# Patient Record
Sex: Male | Born: 1995 | Hispanic: No | Marital: Single
Health system: Southern US, Community
[De-identification: ages and names within clinical notes are randomized; demographics above are authoritative.]

---

## 2015-12-30 ENCOUNTER — Emergency Department (HOSPITAL_COMMUNITY)
Admission: EM | Admit: 2015-12-30 | Discharge: 2015-12-30 | Disposition: A | Discharge status: Home / Self Care | Payer: Managed Care, Other (non HMO) | Source: Home / Self Care | Attending: Family Medicine | Admitting: Family Medicine

## 2015-12-30 ENCOUNTER — Encounter (HOSPITAL_COMMUNITY): Payer: Self-pay | Admitting: *Deleted

## 2015-12-30 ENCOUNTER — Emergency Department (HOSPITAL_COMMUNITY): Payer: Managed Care, Other (non HMO)

## 2015-12-30 ENCOUNTER — Emergency Department (INDEPENDENT_AMBULATORY_CARE_PROVIDER_SITE_OTHER): Payer: Managed Care, Other (non HMO)

## 2015-12-30 DIAGNOSIS — S20212A Contusion of left front wall of thorax, initial encounter: Secondary | ICD-10-CM | POA: Diagnosis not present

## 2015-12-30 MED ORDER — DICLOFENAC POTASSIUM 50 MG PO TABS: 50.0000 mg | ORAL_TABLET | Freq: Three times a day (TID) | ORAL | Status: AC

## 2015-12-30 NOTE — ED Provider Notes (Addendum)
CSN: [id number]     Arrival date & time 12/30/15  1832 History   First MD Initiated Contact with Patient 12/30/15 1839     Chief Complaint  Patient presents with  . Fall   (Consider location/radiation/quality/duration/timing/severity/associated sxs/prior Treatment) Patient is a 20 y.o. male presenting with fall. The history is provided by the patient.  Fall This is a new problem. The current episode started more than 1 week ago (wrestling with freinds 2 weeks ago and still hurts to left parasternal rib.). Pertinent negatives include no shortness of breath.    History reviewed. No pertinent past medical history. History reviewed. No pertinent past surgical history. History reviewed. No pertinent family history. Social History  Substance Use Topics  . Smoking status: None  . Smokeless tobacco: None  . Alcohol Use: No    Review of Systems  Constitutional: Negative.   Respiratory: Negative for cough, shortness of breath and wheezing.   Cardiovascular: Negative.   Gastrointestinal: Negative.   Musculoskeletal: Negative.   Skin: Negative.   All other systems reviewed and are negative.   Allergies  Phenobarbital  Home Medications   Prior to Admission medications   Medication Sig Start Date End Date Taking? Authorizing Provider  diclofenac (CATAFLAM) 50 MG tablet Take 1 tablet (50 mg total) by mouth 3 (three) times daily. For chest pain. 12/30/15   Linna Hoff, MD   Meds Ordered and Administered this Visit  Medications - No data to display  BP 99/61 mmHg  Pulse 98  Temp(Src) 97.9 F (36.6 C) (Oral)  Resp 16  SpO2 98% No data found.   Physical Exam  ED Course  Procedures (including critical care time)  Labs Review Labs Reviewed - No data to display  Imaging Review Dg Ribs Unilateral W/chest Left  12/30/2015  CLINICAL DATA:  Wrestling 2-1/2 weeks ago with left anterior rib pain since then. EXAM: LEFT RIBS AND CHEST - 3+ VIEW COMPARISON:  None. FINDINGS: The  lungs are clear without focal consolidation, edema, effusion or pneumothorax. Cardio pericardial silhouette is within normal limits for size. Imaged bony structures of the thorax are intact. Oblique views of the left ribs were obtained with a radiopaque BB localizing the area of patient concern. No underlying rib fracture is evident. IMPRESSION: Normal exam.  No evidence for left-sided rib fracture. Electronically Signed   By: Kennith Center M.D.   On: 12/30/2015 19:38   X-rays reviewed and report per radiologist.   Visual Acuity Review  Right Eye Distance:   Left Eye Distance:   Bilateral Distance:    Right Eye Near:   Left Eye Near:    Bilateral Near:         MDM    Meds ordered this encounter  Medications  . diclofenac (CATAFLAM) 50 MG tablet    Sig: Take 1 tablet (50 mg total) by mouth 3 (three) times daily. For chest pain.    Dispense:  30 tablet    Refill:  0       Linna Hoff, MD 12/30/15 1952  Linna Hoff, MD 12/30/15 [phone]

## 2015-12-30 NOTE — ED Notes (Addendum)
Pt  Reports  He   Was  Wrestling    With  Friends       And  Injured         His  l  Rib  Cage  He  Reports  Pain on  Palpation  And  Certain         Movements

## 2016-06-30 IMAGING — DX DG RIBS W/ CHEST 3+V*L*
3 series · 3 of 3 positions shown · non-contrast
Comparison: None.

CLINICAL DATA: Wrestling 2-1/2 weeks ago with left anterior rib
pain since then.

EXAM:
LEFT RIBS AND CHEST - 3+ VIEW

[chest pa]
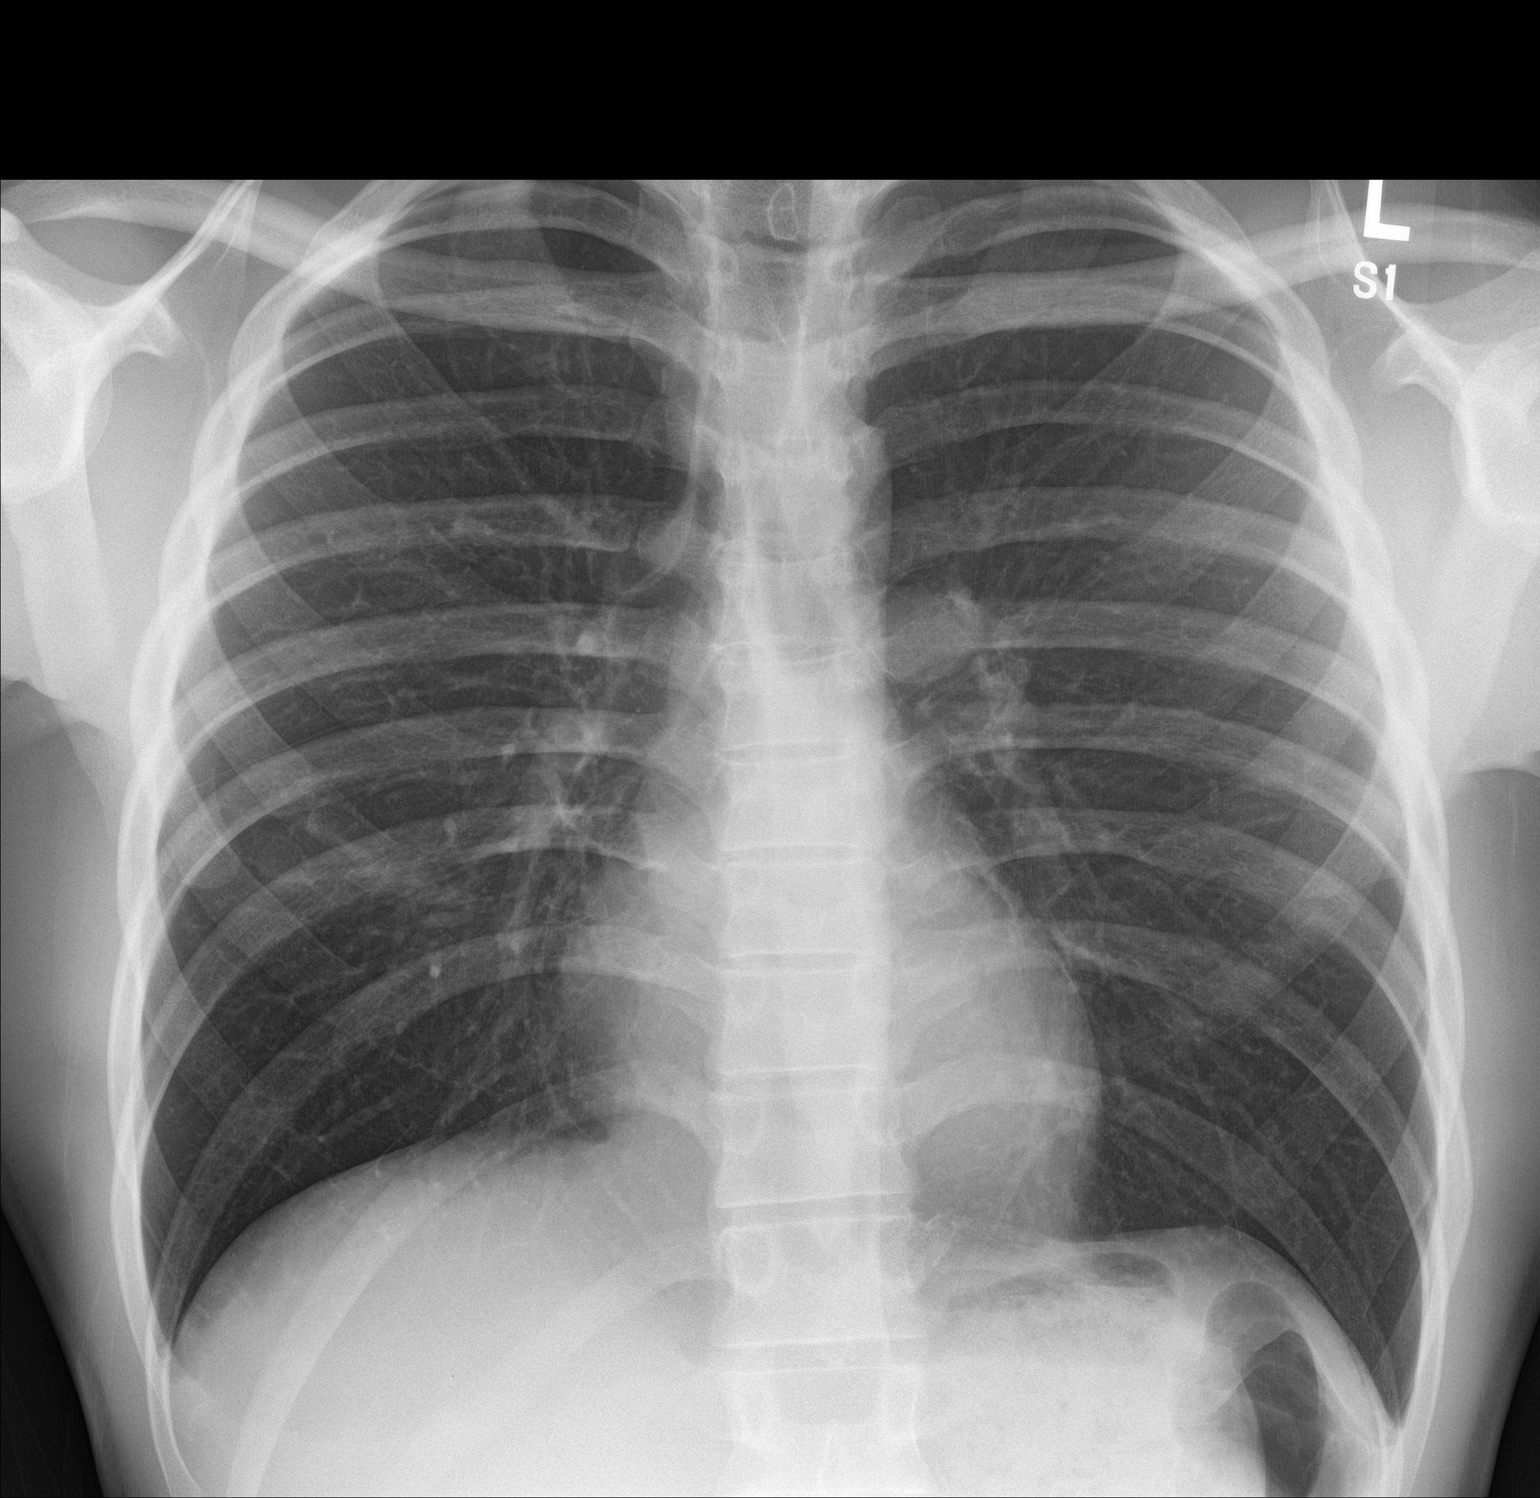

[rib obl]
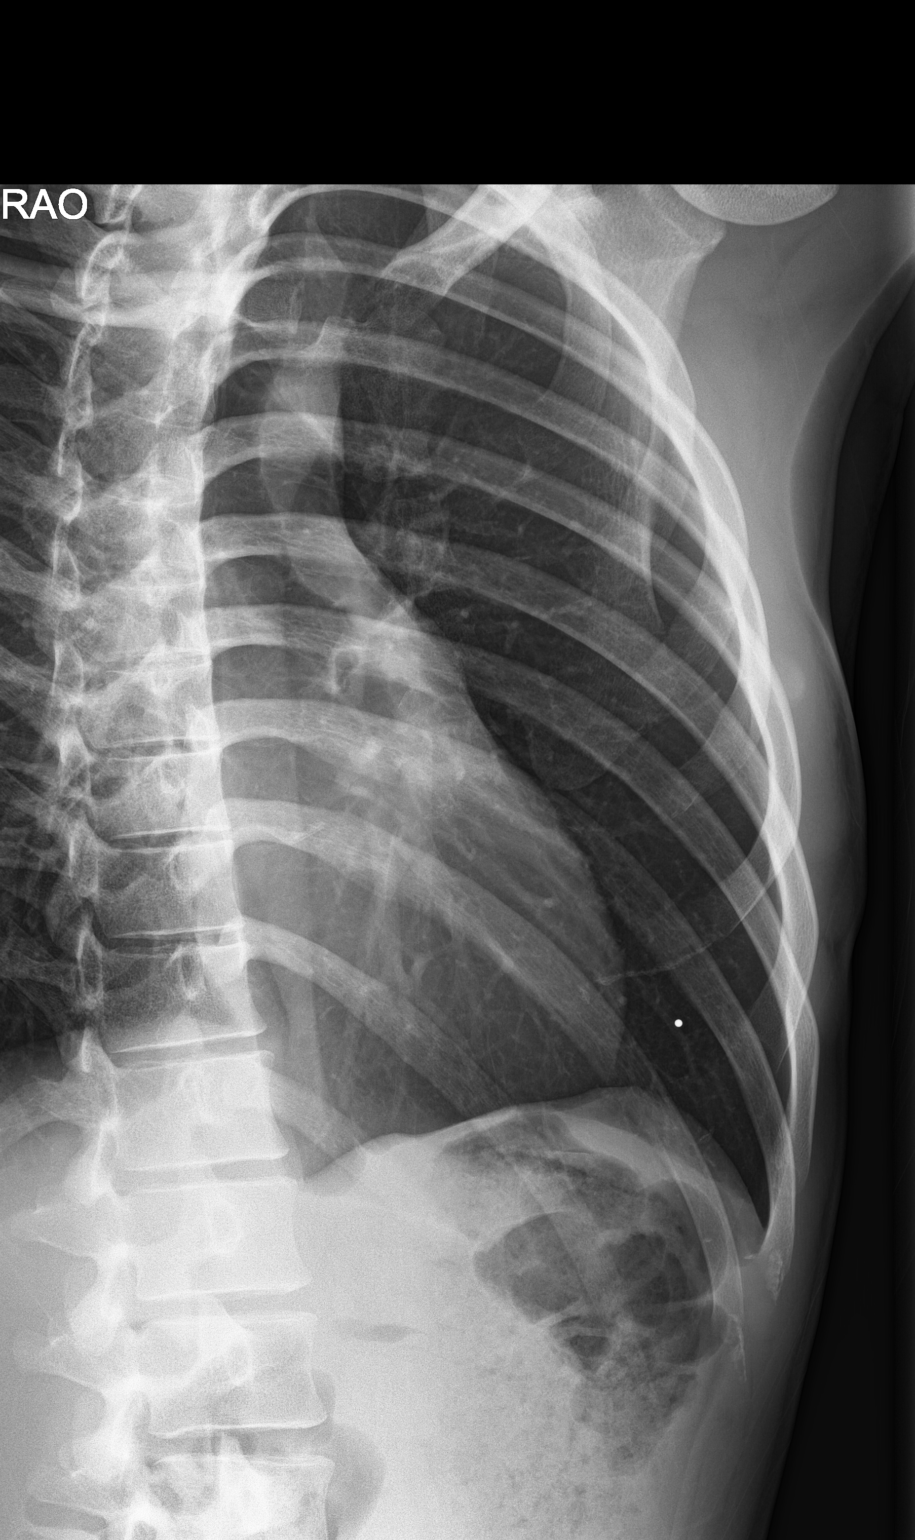

[rib pa]
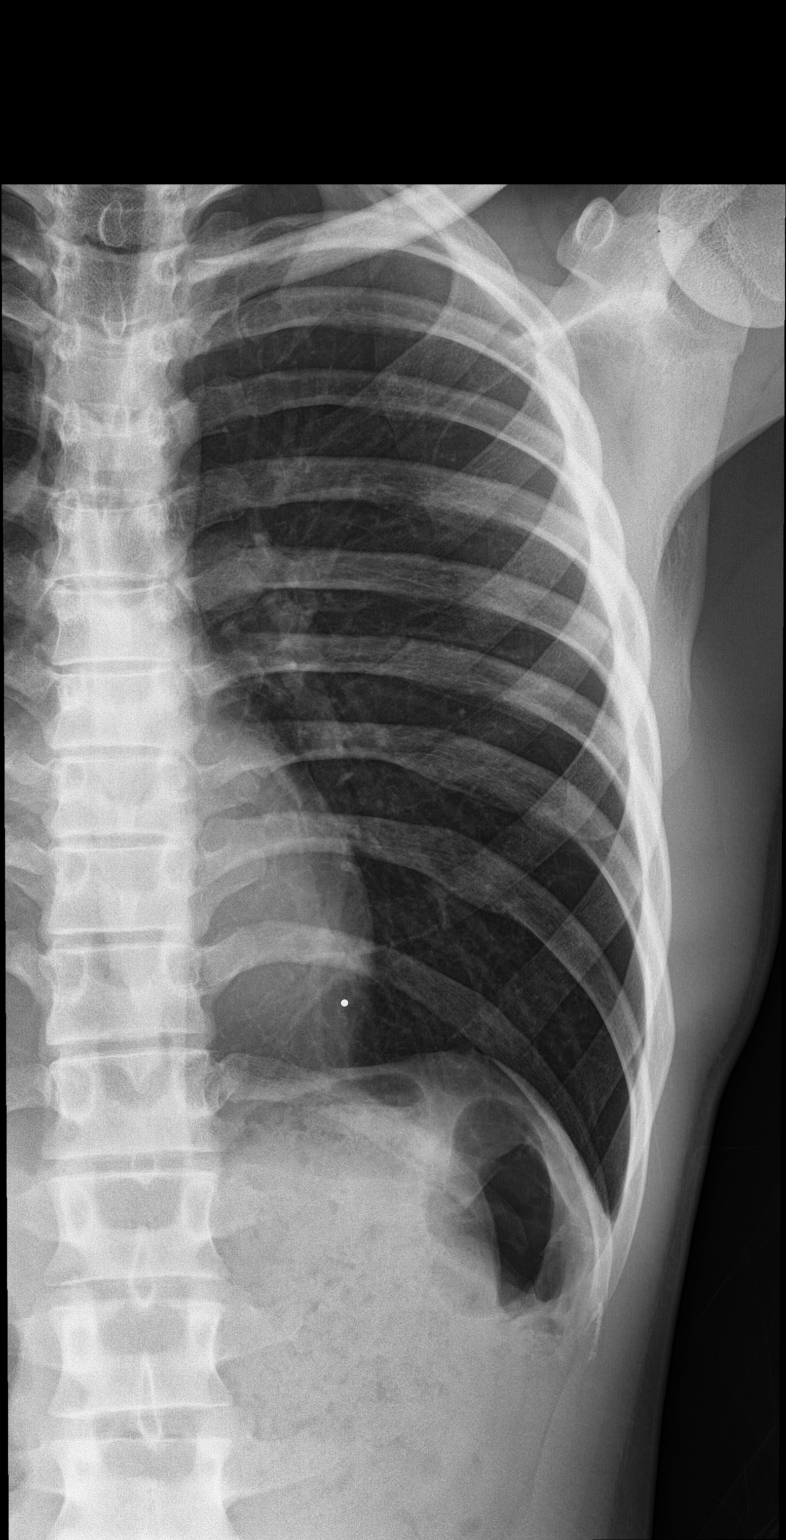

[3 of 3 positions shown; findings below may reference images not displayed]

FINDINGS: The lungs are clear without focal consolidation, edema, effusion or
pneumothorax. Cardio pericardial silhouette is within normal limits
for size. Imaged bony structures of the thorax are intact.

Oblique views of the left ribs were obtained with a radiopaque BB
localizing the area of patient concern. No underlying rib fracture
is evident.
IMPRESSION: Normal exam.  No evidence for left-sided rib fracture.

## 2016-08-28 ENCOUNTER — Encounter (HOSPITAL_COMMUNITY): Payer: Self-pay | Admitting: Emergency Medicine

## 2016-08-28 DIAGNOSIS — K644 Residual hemorrhoidal skin tags: Secondary | ICD-10-CM | POA: Insufficient documentation

## 2016-08-28 NOTE — ED Triage Notes (Signed)
Patient with abscess in buttock cleft on the right side above anus.  Patient having pain while walking and sitting.  Patient states that he was trying to drain it himself, states that there was some drainage, but it has stopped, now is swollen.

## 2016-08-29 ENCOUNTER — Emergency Department (HOSPITAL_COMMUNITY)
Admission: EM | Admit: 2016-08-29 | Discharge: 2016-08-29 | Discharge status: Absent without leave | Payer: Managed Care, Other (non HMO)

## 2016-08-29 ENCOUNTER — Emergency Department (HOSPITAL_COMMUNITY)
Admission: EM | Admit: 2016-08-29 | Discharge: 2016-08-29 | Disposition: A | Discharge status: Home / Self Care | Payer: Managed Care, Other (non HMO) | Attending: Emergency Medicine | Admitting: Emergency Medicine

## 2016-08-29 DIAGNOSIS — K644 Residual hemorrhoidal skin tags: Secondary | ICD-10-CM

## 2016-08-29 MED ORDER — HYDROCORTISONE 2.5 % RE CREA: TOPICAL_CREAM | RECTAL | 0 refills | Status: AC

## 2016-08-29 MED ORDER — DOCUSATE SODIUM 100 MG PO CAPS: 100.0000 mg | ORAL_CAPSULE | Freq: Two times a day (BID) | ORAL | 0 refills | Status: AC

## 2016-08-29 NOTE — ED Notes (Signed)
Patient returned to ED.  Patient's chart undischarged.

## 2016-08-29 NOTE — ED Notes (Signed)
Called pt to recheck vital signs. No answer

## 2016-08-29 NOTE — ED Provider Notes (Signed)
[facility]-EMERGENCY DEPT Provider Note   CSN: [id number] Arrival date & time: 08/28/16  2154     History   Chief Complaint Chief Complaint  Patient presents with  . Abscess    HPI Donald Wise. is a 20 y.o. male.  Patient presents with complaint of perirectal pain and swelling similar to previous abscesses. No fever. He is able to pass stool without difficulty. No bleeding or drainage. He has attempted to open the area at home without success.       History reviewed. No pertinent past medical history.  There are no active problems to display for this patient.   History reviewed. No pertinent surgical history.     Home Medications    Prior to Admission medications   Medication Sig Start Date End Date Taking? Authorizing Provider  diclofenac (CATAFLAM) 50 MG tablet Take 1 tablet (50 mg total) by mouth 3 (three) times daily. For chest pain. Patient not taking: Reported on 08/29/2016 12/30/15   Linna Hoff, MD  docusate sodium (COLACE) 100 MG capsule Take 1 capsule (100 mg total) by mouth every 12 (twelve) hours. 08/29/16   Elpidio Anis, PA-C  hydrocortisone (ANUSOL-HC) 2.5 % rectal cream Apply rectally 2 times daily 08/29/16   Elpidio Anis, PA-C    Family History History reviewed. No pertinent family history.  Social History Social History  Substance Use Topics  . Smoking status: Not on file  . Smokeless tobacco: Never Used  . Alcohol use No     Allergies   Phenobarbital   Review of Systems Review of Systems  Constitutional: Negative for fever.  Gastrointestinal: Positive for rectal pain. Negative for abdominal pain.  Musculoskeletal: Negative for myalgias.  Skin: Positive for wound.     Physical Exam Updated Vital Signs BP 102/58   Pulse 70   Temp 98.8 F (37.1 C) (Oral)   Resp 19   SpO2 99%   Physical Exam  Constitutional: He is oriented to person, place, and time. He appears well-developed and well-nourished.  Neck: Normal range of  motion.  Pulmonary/Chest: Effort normal.  Genitourinary:  Genitourinary Comments: Large external hemorrhoid without thrombosis.   Musculoskeletal: Normal range of motion.  Neurological: He is alert and oriented to person, place, and time.  Skin: Skin is warm and dry.  Psychiatric: He has a normal mood and affect.     ED Treatments / Results  Labs (all labs ordered are listed, but only abnormal results are displayed) Labs Reviewed - No data to display  EKG  EKG Interpretation None       Radiology No results found.  Procedures Procedures (including critical care time)  Medications Ordered in ED Medications - No data to display   Initial Impression / Assessment and Plan / ED Course  I have reviewed the triage vital signs and the nursing notes.  Pertinent labs & imaging results that were available during my care of the patient were reviewed by me and considered in my medical decision making (see chart for details).  Clinical Course    Patient presents with large non-thrombosed external hemorrhoid requiring supportive care.   Final Clinical Impressions(s) / ED Diagnoses   Final diagnoses:  External hemorrhoid    New Prescriptions New Prescriptions   DOCUSATE SODIUM (COLACE) 100 MG CAPSULE    Take 1 capsule (100 mg total) by mouth every 12 (twelve) hours.   HYDROCORTISONE (ANUSOL-HC) 2.5 % RECTAL CREAM    Apply rectally 2 times daily     Elpidio Anis,  PA-C 08/29/16 0540    Zadie Rhineonald Wickline, MD 08/29/16 [phone]
